# Patient Record
Sex: Male | Born: 1975 | Race: White | Hispanic: No | Marital: Married | Smoking: Current every day smoker
Health system: Southern US, Community
[De-identification: ages and names within clinical notes are randomized; demographics above are authoritative.]

## PROBLEM LIST (undated history)

## (undated) DIAGNOSIS — R51 Headache: Secondary | ICD-10-CM

## (undated) DIAGNOSIS — N200 Calculus of kidney: Secondary | ICD-10-CM

## (undated) HISTORY — PX: TRANSFER ADJACENT TISSUE GENITALIA: SUR1370

---

## 2010-07-19 DIAGNOSIS — N200 Calculus of kidney: Secondary | ICD-10-CM

## 2010-07-19 HISTORY — DX: Calculus of kidney: N20.0

## 2011-07-21 ENCOUNTER — Inpatient Hospital Stay (HOSPITAL_COMMUNITY)
Admission: EM | Admit: 2011-07-21 | Discharge: 2011-07-23 | DRG: 321 | Disposition: A | Discharge status: Home / Self Care | Payer: BC Managed Care – PPO | Attending: Internal Medicine | Admitting: Internal Medicine

## 2011-07-21 ENCOUNTER — Emergency Department (HOSPITAL_COMMUNITY): Payer: BC Managed Care – PPO

## 2011-07-21 ENCOUNTER — Encounter (HOSPITAL_COMMUNITY): Payer: Self-pay | Admitting: Physical Medicine and Rehabilitation

## 2011-07-21 DIAGNOSIS — N390 Urinary tract infection, site not specified: Secondary | ICD-10-CM

## 2011-07-21 DIAGNOSIS — N179 Acute kidney failure, unspecified: Secondary | ICD-10-CM | POA: Diagnosis present

## 2011-07-21 DIAGNOSIS — F17200 Nicotine dependence, unspecified, uncomplicated: Secondary | ICD-10-CM | POA: Diagnosis present

## 2011-07-21 DIAGNOSIS — N12 Tubulo-interstitial nephritis, not specified as acute or chronic: Principal | ICD-10-CM | POA: Diagnosis present

## 2011-07-21 DIAGNOSIS — B009 Herpesviral infection, unspecified: Secondary | ICD-10-CM | POA: Diagnosis present

## 2011-07-21 DIAGNOSIS — N200 Calculus of kidney: Secondary | ICD-10-CM | POA: Insufficient documentation

## 2011-07-21 DIAGNOSIS — N3289 Other specified disorders of bladder: Secondary | ICD-10-CM

## 2011-07-21 HISTORY — DX: Headache: R51

## 2011-07-21 HISTORY — DX: Calculus of kidney: N20.0

## 2011-07-21 LAB — COMPREHENSIVE METABOLIC PANEL
ALT: 45 U/L (ref 0–53)
Calcium: 9.6 mg/dL (ref 8.4–10.5)
Creatinine, Ser: 1.21 mg/dL (ref 0.50–1.35)
GFR calc Af Amer: 88 mL/min — ABNORMAL LOW (ref >90)
Glucose, Bld: 104 mg/dL — ABNORMAL HIGH (ref 70–99)
Sodium: 138 mEq/L (ref 135–145)
Total Protein: 7.4 g/dL (ref 6.0–8.3)

## 2011-07-21 LAB — URINALYSIS, ROUTINE W REFLEX MICROSCOPIC
Bilirubin Urine: NEGATIVE
Nitrite: POSITIVE — AB
Specific Gravity, Urine: 1.013 (ref 1.005–1.030)
Urobilinogen, UA: 1 mg/dL (ref 0.0–1.0)
pH: 7.5 (ref 5.0–8.0)

## 2011-07-21 LAB — URINE MICROSCOPIC-ADD ON

## 2011-07-21 LAB — CBC WITH DIFFERENTIAL/PLATELET
Basophils Absolute: 0 10*3/uL (ref 0.0–0.1)
Eosinophils Absolute: 0 10*3/uL (ref 0.0–0.7)
Eosinophils Relative: 0 % (ref 0–5)
HCT: 44.5 % (ref 39.0–52.0)
Lymphs Abs: 1.1 10*3/uL (ref 0.7–4.0)
MCH: 30.8 pg (ref 26.0–34.0)
MCV: 89.5 fL (ref 78.0–100.0)
Monocytes Absolute: 0.1 10*3/uL (ref 0.1–1.0)
Platelets: 276 10*3/uL (ref 150–400)
RDW: 13.1 % (ref 11.5–15.5)

## 2011-07-21 LAB — CREATININE, URINE, RANDOM: Creatinine, Urine: 214.28 mg/dL

## 2011-07-21 MED ORDER — SODIUM CHLORIDE 0.9 % IV SOLN: Freq: Once | INTRAVENOUS | Status: DC

## 2011-07-21 MED ORDER — SODIUM CHLORIDE 0.9 % IV SOLN
INTRAVENOUS | Status: AC
  Administered 2011-07-21 (×2): via INTRAVENOUS
  Administered 2011-07-22: 1000 mL via INTRAVENOUS
  Administered 2011-07-22: 05:00:00 via INTRAVENOUS

## 2011-07-21 MED ORDER — SODIUM CHLORIDE 0.9 % IV BOLUS (SEPSIS)
1000.0000 mL | Freq: Once | INTRAVENOUS | Status: AC
  Administered 2011-07-21: 1000 mL via INTRAVENOUS

## 2011-07-21 MED ORDER — MORPHINE SULFATE 2 MG/ML IJ SOLN: 2.0000 mg | INTRAMUSCULAR | Status: DC | PRN

## 2011-07-21 MED ORDER — ONDANSETRON HCL 4 MG PO TABS: 4.0000 mg | ORAL_TABLET | Freq: Four times a day (QID) | ORAL | Status: DC | PRN

## 2011-07-21 MED ORDER — ACETAMINOPHEN 650 MG RE SUPP: 650.0000 mg | Freq: Four times a day (QID) | RECTAL | Status: DC | PRN

## 2011-07-21 MED ORDER — KETOROLAC TROMETHAMINE 30 MG/ML IJ SOLN
30.0000 mg | Freq: Once | INTRAMUSCULAR | Status: AC
  Administered 2011-07-21: 30 mg via INTRAVENOUS
  Filled 2011-07-21: qty 1

## 2011-07-21 MED ORDER — DEXTROSE 5 % IV SOLN
1.0000 g | INTRAVENOUS | Status: DC
  Administered 2011-07-21 – 2011-07-23 (×3): 1 g via INTRAVENOUS
  Filled 2011-07-21 (×4): qty 10

## 2011-07-21 MED ORDER — ONDANSETRON HCL 4 MG/2ML IJ SOLN: 4.0000 mg | Freq: Four times a day (QID) | INTRAMUSCULAR | Status: DC | PRN

## 2011-07-21 MED ORDER — ACETAMINOPHEN 325 MG PO TABS
650.0000 mg | ORAL_TABLET | Freq: Four times a day (QID) | ORAL | Status: DC | PRN
  Administered 2011-07-21 – 2011-07-22 (×3): 650 mg via ORAL
  Filled 2011-07-21 (×3): qty 2

## 2011-07-21 MED ORDER — ONDANSETRON HCL 4 MG/2ML IJ SOLN
4.0000 mg | Freq: Once | INTRAMUSCULAR | Status: AC
  Administered 2011-07-21: 4 mg via INTRAVENOUS
  Filled 2011-07-21: qty 2

## 2011-07-21 MED ORDER — ACETAMINOPHEN 325 MG PO TABS
ORAL_TABLET | ORAL | Status: AC
  Administered 2011-07-21: 650 mg
  Filled 2011-07-21: qty 2

## 2011-07-21 NOTE — Progress Notes (Signed)
07/21/11  1706 Pt. Shivering;temp. Taken 101.1-Tylenol given and Dr. Ronita Hipps med student notified.  Leandrew Koyanagi Timo Hartwig,RN

## 2011-07-21 NOTE — H&P (Signed)
Hospital Admission Note Date: 07/21/2011  Patient name: Joshua Melendez Medical record number: [id number] Date of birth: 08-14-1975 Age: 36 y.o. Gender: male PCP: No primary provider on file.  Medical Service: Internal Medicine Teaching Service, [facility] Service  Attending physician:  Dr. Doneen Poisson    1st Contact: Alanson Puls, MS IV Pager: [phone] 2nd Contact: Dr. Lyn Hollingshead   Pager: [phone] After 5 pm or weekends: 1st Contact:      Pager: [phone] 2nd Contact:      Pager: [phone]  Chief Complaint: Fever, bilateral flank pain  History of Present Illness: Mr. Langlois is a 35 year old man with a PMH significant for nephrolithiasis who presents to the [facility] ED with a 1 week history of bilateral flank pain, fever, and dysuria.  He states that it started as a pain in the bilateral low back and a mild fever that resolved with tylenol but the morning of admission he continued to have the pain and dysuria and spiked a fever to 103 F.  He states that he has had a burning sensation when urinating for 1 week and also noticed blood in his urine during that time.  He has a history of kidney stones for the last year and actually passed a stone several months ago.  He denies weight loss, nausea, vomiting, headaches, neck stiffness, or abdominal pain.  He takes no other medications and has no other medical problems.   Meds: No current outpatient prescriptions on file.  Allergies: Allergies as of 07/21/2011  . (No Known Allergies)   Past Medical History  Diagnosis Date  . Nephrolithiasis 07/2010    initially found on CT 1 year ago in [place], repeat stones in 04/2011   History reviewed. No pertinent past surgical history. Family History  Problem Relation Age of Onset  . Hypertension Mother   . Diabetes Father   . Stroke Father 30  . Stroke Maternal Grandmother   . Stroke Maternal Grandfather    History   Social History  . Marital Status: Married    Spouse Name: N/A    Number of Children:  N/A  . Years of Education: N/A   Occupational History  . Not on file.   Social History Main Topics  . Smoking status: Current Everyday Smoker -- 1.0 packs/day    Types: Cigarettes  . Smokeless tobacco: Not on file  . Alcohol Use: No  . Drug Use: No  . Sexually Active:    Other Topics Concern  . Not on file   Social History Narrative   Newly immigrated to [place] from [place] in 08/2010.  Over the road truck driver.  Lives in [place] with wife and 85 year old son.  Has green card and insurance through work.    Review of Systems: Constitutional: Positive for fever, chills.  Denies diaphoresis, appetite change and fatigue.  HEENT: Denies photophobia, eye pain, redness, hearing loss, ear pain, congestion, sore throat, rhinorrhea, sneezing, mouth sores, trouble swallowing, neck pain, neck stiffness and tinnitus.   Respiratory: Denies SOB, DOE, cough, chest tightness,  and wheezing.   Cardiovascular: Denies chest pain, palpitations and leg swelling.  Gastrointestinal: Denies nausea, vomiting, abdominal pain, diarrhea, constipation, blood in stool and abdominal distention.  Genitourinary: Positive for dysuria, hematuria, flank pain.  Denies urgency, frequency, and difficulty urinating.  Musculoskeletal: Denies myalgias, back pain, joint swelling, arthralgias and gait problem.  Skin: Denies pallor, rash and wound.  Neurological: Denies dizziness, seizures, syncope, weakness, light-headedness, numbness and headaches.  Hematological: Denies adenopathy.  Easy bruising, personal or family bleeding history  Psychiatric/Behavioral: Denies suicidal ideation, mood changes, confusion, nervousness, sleep disturbance and agitation  Physical Exam: Blood pressure 112/68, pulse 103, temperature 98.7 F (37.1 C), temperature source Oral, resp. rate 18, height 5\' 7"  (1.702 m), weight 194 lb 6.4 oz (88.179 kg), SpO2 95.00%. Constitutional: Vital signs reviewed.  Patient is a well-developed and well-nourished  man in no acute distress and cooperative with exam. Alert and oriented x3. His wife is at bedside and helps with translation.  He speaks [place] as his native language. He is diaphoretic and appears mildly uncomfortable.  Head: Normocephalic and atraumatic Ear: TM normal bilaterally Mouth: no erythema or exudates, MMM Eyes: PERRL, EOMI, conjunctivae normal, No scleral icterus.  Neck: Supple, Trachea midline normal ROM, No JVD, mass, thyromegaly, or carotid bruit present.  Cardiovascular: RRR, S1 normal, S2 normal, no MRG, pulses symmetric and intact bilaterally Pulmonary/Chest: CTAB, no wheezes, rales, or rhonchi Abdominal: Soft. Non-tender, non-distended, bowel sounds are normal, no masses, organomegaly, or guarding present.  GU: no CVA tenderness Musculoskeletal: No joint deformities, erythema, or stiffness, ROM full and no nontender Hematology: no cervical, inginal, or axillary adenopathy.  Neurological: A&O x3, Strength is normal and symmetric bilaterally, cranial nerve II-XII are grossly intact, no focal motor deficit, sensory intact to light touch bilaterally.  Skin: Warm, dry and intact. No rash, cyanosis, or clubbing.  Psychiatric: Normal mood and affect. speech and behavior is normal. Judgment and thought content normal. Cognition and memory are normal.   Lab results: Basic Metabolic Panel:  Basename 07/21/11 1009  NA 138  K 4.1  CL 101  CO2 23  GLUCOSE 104*  BUN 10  CREATININE 1.21  CALCIUM 9.6  MG --  PHOS --   Liver Function Tests:  Basename 07/21/11 1009  AST 30  ALT 45  ALKPHOS 138*  BILITOT 1.0  PROT 7.4  ALBUMIN 3.9   CBC:  Basename 07/21/11 1009  WBC 14.7*  NEUTROABS 13.5*  HGB 15.3  HCT 44.5  MCV 89.5  PLT 276   Urinalysis:  Basename 07/21/11 0955  COLORURINE YELLOW  LABSPEC 1.013  PHURINE 7.5  GLUCOSEU NEGATIVE  HGBUR NEGATIVE  BILIRUBINUR NEGATIVE  KETONESUR NEGATIVE  PROTEINUR NEGATIVE  UROBILINOGEN 1.0  NITRITE POSITIVE*    LEUKOCYTESUR TRACE*   Imaging results:  Ct Abdomen Pelvis Wo Contrast  07/21/2011  *RADIOLOGY REPORT*  Clinical Data: Bilateral flank pain, dysuria, urinary urgency; gross hematuria last week, history kidney stones  CT ABDOMEN AND PELVIS WITHOUT CONTRAST  Technique:  Multidetector CT imaging of the abdomen and pelvis was performed following the standard protocol without intravenous contrast. Sagittal and coronal MPR images reconstructed from axial data set.  Comparison: None  Findings: Bibasilar atelectasis. Scattered respiratory motion artifacts. Tiny nonobstructing calculus upper pole left kidney. No additional urinary tract calcification, hydronephrosis, or ureteral dilatation. Normal size prostate gland. Questionable small hyperdense mass at anterior dome of bladder 1.5 x 1.3 x 1.1 cm. This could represent a bladder tumor or urachal remnant/tumor.  Within limits of a nonenhanced exam, no significant abnormalities of the liver, spleen, pancreas, kidneys, or adrenal glands otherwise identified. Normal appendix. Tiny umbilical hernia containing fat. Stomach and bowel loops normal appearance. No mass, adenopathy, free fluid, or inflammatory process. No acute osseous findings.  IMPRESSION: Tiny nonobstructing calculus upper pole left kidney. Small soft tissue nodule 1.5 x 1.3 x 1.1 cm at the anterior superior margin of the urinary bladder, cannot exclude a urachal abnormality/tumor or bladder tumor. Cystoscopic evaluation recommended.  No evidence of urinary tract obstruction or dilatation. Tiny umbilical hernia containing fat.  Original Report Authenticated By: Lollie Marrow, M.D.   Assessment & Plan by Problem: 1. Pyelonephritis:  Mr. Laraia is a 36 year old man who presents with flank pain, dysuria, and fever for the last week.  His Urinalysis is positive for nitrates, 7-10 WBC and many bacteria.  CT scan does not show signs of pyelonephritis but these findings coupled with his increased WBC count indicate  pyelonephritis.    - Admit to med-surg  - Rocephin 1 g IV Q24  - Tylenol for fever and pain  - Urine culture to narrow Abx and possible change to oral.   2. Possible Bladder Mass: CT scan showed a questionable mass in the anterior dome of the bladder that was 1.5 cm by 1.3 cm by 1.1 cm.  He is a smoker and has had some hematuria but his UA done today is not positive for blood.  Differential diagnosis includes artifact vs possible bladder mass.    - Urology consult for cystoscopy   3. Acute kidney injury:  His Creatinine today is 1.21 without a known baseline.  His calculated GFR is 96 ml/min.  He has no history of renal disease. He does state that over the last week his appetite has been down because he was feeling ill.   - Check Urine lytes to calculate FENa  - Hydration with NS  - Recheck in AM.  4.  VTE: SCDs  Signed: Alexus Michael 07/21/2011, 2:19 PM

## 2011-07-21 NOTE — Consult Note (Signed)
Consult: bladder mass, pyelonephritis, kidney stone Requested by Dr. Josem Melendez  History of Present Illness: [name] is a 36 year old man who presented to the [facility] ED with a 2 week history of dysuria. 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 He states that it started as dysuria, progressed to pain in the bilateral low back and a mild fever that resolved with tylenol. However, by this morning he developed high fever, chills and and severe bilateral flank pain. He states that he has had a burning sensation when urinating for 2 weeks. He has noticed red urine, but no gross hematuria with clots.   He reports he is voiding with a good stream and has no bothersome lower urinary tract symptoms apart from the dysuria.  He has a history of kidney stones for the last year and passed a stone last year while they still lived in [place]. His wife served as Equities trader and she is an Radio producer. e denies weight loss, nausea, vomiting, headaches, neck stiffness, or abdominal pain. He takes no other medications and has no other medical problems.    Past Medical History  Diagnosis Date  . Nephrolithiasis 07/2010    initially found on CT 1 year ago in [place], repeat stones in 04/2011  . Headache    Past Surgical History  Procedure Date  . Transfer adjacent tissue genitalia     Home Medications:  No prescriptions prior to admission   Allergies: No Known Allergies  Family History  Problem Relation Age of Onset  . Hypertension Mother   . Diabetes Father   . Stroke Father 70  . Stroke Maternal Grandmother   . Stroke Maternal Grandfather    Social History:  reports that he has been smoking Cigarettes.  He has a 10 pack-year smoking history. He has never used smokeless tobacco. He reports that he does not drink alcohol or use illicit drugs.  ROS: A complete review of systems was performed.  All systems are negative except for pertinent findings as noted. @ROS @   Physical Exam:  Vital signs in  last 24 hours: Temp:  [98.7 F (37.1 C)-103.3 F (39.6 C)] 103.3 F (39.6 C) (07/03 1839) Pulse Rate:  [103-150] 103  (07/03 1343) Resp:  [18-24] 18  (07/03 1343) BP: (99-112)/(51-89) 112/68 mmHg (07/03 1343) SpO2:  [95 %-99 %] 95 % (07/03 1343) Weight:  [88.179 kg (194 lb 6.4 oz)] 88.179 kg (194 lb 6.4 oz) (07/03 1343) General:  Alert and oriented, No acute distress. Looks sweaty.  HEENT: Normocephalic, atraumatic Neck: No JVD or lymphadenopathy Cardiovascular: Regular rate and rhythm Lungs: Regular rate and effort Abdomen: Soft, nontender, nondistended, no abdominal masses Back: No CVA tenderness Extremities: No edema Neurologic: Grossly intact  Laboratory Data:  Results for orders placed during the hospital encounter of 07/21/11 (from the past 24 hour(s))  URINALYSIS, ROUTINE W REFLEX MICROSCOPIC     Status: Abnormal   Collection Time   07/21/11  9:55 AM      Component Value Range   Color, Urine YELLOW  YELLOW   APPearance CLEAR  CLEAR   Specific Gravity, Urine 1.013  1.005 - 1.030   pH 7.5  5.0 - 8.0   Glucose, UA NEGATIVE  NEGATIVE mg/dL   Hgb urine dipstick NEGATIVE  NEGATIVE   Bilirubin Urine NEGATIVE  NEGATIVE   Ketones, ur NEGATIVE  NEGATIVE mg/dL   Protein, ur NEGATIVE  NEGATIVE mg/dL   Urobilinogen, UA 1.0  0.0 - 1.0 mg/dL  Nitrite POSITIVE (*) NEGATIVE   Leukocytes, UA TRACE (*) NEGATIVE  URINE MICROSCOPIC-ADD ON     Status: Abnormal   Collection Time   07/21/11  9:55 AM      Component Value Range   WBC, UA 7-10  <3 WBC/hpf   RBC / HPF 0-2  <3 RBC/hpf   Bacteria, UA MANY (*) RARE  CBC WITH DIFFERENTIAL     Status: Abnormal   Collection Time   07/21/11 10:09 AM      Component Value Range   WBC 14.7 (*) 4.0 - 10.5 K/uL   RBC 4.97  4.22 - 5.81 MIL/uL   Hemoglobin 15.3  13.0 - 17.0 g/dL   HCT 04.5  40.9 - 81.1 %   MCV 89.5  78.0 - 100.0 fL   MCH 30.8  26.0 - 34.0 pg   MCHC 34.4  30.0 - 36.0 g/dL   RDW 91.4  78.2 - 95.6 %   Platelets 276  150 - 400 K/uL    Neutrophils Relative 92 (*) 43 - 77 %   Neutro Abs 13.5 (*) 1.7 - 7.7 K/uL   Lymphocytes Relative 7 (*) 12 - 46 %   Lymphs Abs 1.1  0.7 - 4.0 K/uL   Monocytes Relative 1 (*) 3 - 12 %   Monocytes Absolute 0.1  0.1 - 1.0 K/uL   Eosinophils Relative 0  0 - 5 %   Eosinophils Absolute 0.0  0.0 - 0.7 K/uL   Basophils Relative 0  0 - 1 %   Basophils Absolute 0.0  0.0 - 0.1 K/uL  COMPREHENSIVE METABOLIC PANEL     Status: Abnormal   Collection Time   07/21/11 10:09 AM      Component Value Range   Sodium 138  135 - 145 mEq/L   Potassium 4.1  3.5 - 5.1 mEq/L   Chloride 101  96 - 112 mEq/L   CO2 23  19 - 32 mEq/L   Glucose, Bld 104 (*) 70 - 99 mg/dL   BUN 10  6 - 23 mg/dL   Creatinine, Ser 2.13  0.50 - 1.35 mg/dL   Calcium 9.6  8.4 - 08.6 mg/dL   Total Protein 7.4  6.0 - 8.3 g/dL   Albumin 3.9  3.5 - 5.2 g/dL   AST 30  0 - 37 U/L   ALT 45  0 - 53 U/L   Alkaline Phosphatase 138 (*) 39 - 117 U/L   Total Bilirubin 1.0  0.3 - 1.2 mg/dL   GFR calc non Af Amer 76 (*) >90 mL/min   GFR calc Af Amer 88 (*) >90 mL/min   No results found for this or any previous visit (from the past 240 hour(s)). Creatinine:  Basename 07/21/11 1009  CREATININE 1.21   CT A/P - I reviewed the images and reviewed the images with the patient and wife on the computer in his room. There is a hint of calcium in a left upper pole papilla - likely a very early stone, too small to measure. No hydronephrosis. Thick, hyperdense area anterior bladder c/w possible urachal remnant/bladder neoplasm.   Impression/Assessment:  -Pyelonephritis - likely from an ascending prostatitis, UTI that has been festering for a couple of week.                            On Rocephin.  -Nephrolithiasis - no clinically significant stones at present -Urachal remnant/bladder neoplasm - will need cystoscopy in  the outpatient office in 2-3 weeks once he settles down from his infection.   Plan:  -No GU intervention needed -Patient and wife given my  card/contact info and discussed importance of f/u for cystoscopy. This will also allow an opportunity to recheck a U/A with C&S if needed.   Joshua Melendez 07/21/2011, 8:29 PM

## 2011-07-21 NOTE — ED Notes (Signed)
Pt. Back from xray

## 2011-07-21 NOTE — Progress Notes (Signed)
1400 Patient arrived to floor via stretcher from ER live at home with wife who is at the bedside. Nontelemetry  Skin WNL

## 2011-07-21 NOTE — Care Management Note (Unsigned)
    Page 1 of 1   07/21/2011     5:07:40 PM   CARE MANAGEMENT NOTE 07/21/2011  Patient:  Joshua Melendez, Joshua Melendez   Account Number:  [id number]  Date Initiated:  07/21/2011  Documentation initiated by:  Letha Cape  Subjective/Objective Assessment:   dx pyelonephritis  admit- lives with spouse. pta independent.     Action/Plan:   Anticipated DC Date:  07/23/2011   Anticipated DC Plan:  HOME/SELF CARE      DC Planning Services  CM consult      Choice offered to / List presented to:             Status of service:  In process, will continue to follow Medicare Important Message given?   (If response is "NO", the following Medicare IM given date fields will be blank) Date Medicare IM given:   Date Additional Medicare IM given:    Discharge Disposition:    Per UR Regulation:  Reviewed for med. necessity/level of care/duration of stay  If discussed at Long Length of Stay Meetings, dates discussed:    Comments:  07/21/11 17:05 Letha Cape RN, BSN [phone] patient lives with spouse, pta independent, patient does not speak [place], but spouse translates for him.  Patient has medication coverage and transportation.  Per spouse he does not have a PCP but they will go through their insurance to get PCP.  NCM will continue to follow for dc needs.

## 2011-07-21 NOTE — ED Notes (Signed)
Pt presents to department for evaluation of bilateral flank pain, diaphoresis, and dysuria/hematuria. Onset today. Pt states history of kidney stones, thinks this could be same. Also states vomiting and fever. 10/10 pain at the time. Pt is alert and oriented x4.

## 2011-07-21 NOTE — ED Notes (Signed)
Pt. Has hx of kidney stone. Last one was one month ago. Sx appear the same as they did then,.

## 2011-07-21 NOTE — Plan of Care (Signed)
Problem: Phase I Progression Outcomes Goal: Hemodynamically stable Outcome: Not Progressing See progress notes.

## 2011-07-21 NOTE — Progress Notes (Signed)
07/21/11  1750  T. Campbell,MD med student called again and informed of temp 102.8; and informed her that pt.'s wife wants to speak with a Dr. about labs esp. urine results, and stated someone will come.  Leandrew Koyanagi Benuel Ly,RN

## 2011-07-21 NOTE — Progress Notes (Signed)
07/21/11  1835  Temp. 103.3  Dr. Sherrine Maples called and informed; and told again that wife wants to speak with Dr. about temp. and results.  Informed wife that I called the Dr.  Leandrew Koyanagi. Noris Kulinski,RN

## 2011-07-21 NOTE — ED Provider Notes (Signed)
History     CSN: [id number]  Arrival date & time 07/21/11  0910   First MD Initiated Contact with Patient 07/21/11 [phone]      Chief Complaint  Patient presents with  . Flank Pain  . Dysuria    (Consider location/radiation/quality/duration/timing/severity/associated sxs/prior treatment) Patient is a 36 y.o. male presenting with flank pain and dysuria. The history is provided by the patient and the spouse.  Flank Pain  Dysuria  Associated symptoms include flank pain.  He having intermittent bilateral flank pain for the last week but it got much worse this morning. Pain was severe and rated at 10 over 10. He also had a headache this morning. He had nausea and vomiting and chills. He is not sure if he had a fever or not. He is now resolved and back pain is present but less severe. Was diagnosed with a kidney stone 6 months ago and is worried that he has a recurrence of the kidney stones. This diagnosis was made while he was living in [place]. Makes his pain any better nothing makes it any worse. Dysuria and hematuria.  No past medical history on file.  No past surgical history on file.  History reviewed. No pertinent family history.  History  Substance Use Topics  . Smoking status: Current Everyday Smoker -- 1.0 packs/day    Types: Cigarettes  . Smokeless tobacco: Not on file  . Alcohol Use: No      Review of Systems  Genitourinary: Positive for dysuria and flank pain.  All other systems reviewed and are negative.    Allergies  Review of patient's allergies indicates no known allergies.  Home Medications  No current outpatient prescriptions on file.  BP 101/51  Pulse 150  Resp 24  SpO2 99%  Physical Exam  Nursing note and vitals reviewed. -year-old male who is resting comfortably and in no acute distress. Vital signs are significant for fever with temperature 103, tachycardia with heart rate 150, tachypnea with respiratory rate of 24. Oxygen saturation is 99%  which is normal. Head is normocephalic and atraumatic. PERRLA, EOMI. Fundi show no hemorrhage, exudate, or papilledema. Neck is nontender and supple. Back is nontender. Lungs are clear without rales, wheezes, rhonchi. Heart has regular rate rhythm without murmur. Abdomen is soft, flat, nontender without masses or hepatosplenomegaly. Peristalsis is decreased. Extremities have no cyanosis or edema, full range of motion is present. Skin is warm and dry without rash. Neurologic: Mental status is normal, cranial nerves are intact, there are no motor or sensory deficits.  ED Course  Procedures (including critical care time)  Results for orders placed during the hospital encounter of 07/21/11  CBC WITH DIFFERENTIAL      Component Value Range   WBC 14.7 (*) 4.0 - 10.5 K/uL   RBC 4.97  4.22 - 5.81 MIL/uL   Hemoglobin 15.3  13.0 - 17.0 g/dL   HCT 11.9  14.7 - 82.9 %   MCV 89.5  78.0 - 100.0 fL   MCH 30.8  26.0 - 34.0 pg   MCHC 34.4  30.0 - 36.0 g/dL   RDW 56.2  13.0 - 86.5 %   Platelets 276  150 - 400 K/uL   Neutrophils Relative 92 (*) 43 - 77 %   Neutro Abs 13.5 (*) 1.7 - 7.7 K/uL   Lymphocytes Relative 7 (*) 12 - 46 %   Lymphs Abs 1.1  0.7 - 4.0 K/uL   Monocytes Relative 1 (*) 3 - 12 %  Monocytes Absolute 0.1  0.1 - 1.0 K/uL   Eosinophils Relative 0  0 - 5 %   Eosinophils Absolute 0.0  0.0 - 0.7 K/uL   Basophils Relative 0  0 - 1 %   Basophils Absolute 0.0  0.0 - 0.1 K/uL  COMPREHENSIVE METABOLIC PANEL      Component Value Range   Sodium 138  135 - 145 mEq/L   Potassium 4.1  3.5 - 5.1 mEq/L   Chloride 101  96 - 112 mEq/L   CO2 23  19 - 32 mEq/L   Glucose, Bld 104 (*) 70 - 99 mg/dL   BUN 10  6 - 23 mg/dL   Creatinine, Ser 3.08  0.50 - 1.35 mg/dL   Calcium 9.6  8.4 - 65.7 mg/dL   Total Protein 7.4  6.0 - 8.3 g/dL   Albumin 3.9  3.5 - 5.2 g/dL   AST 30  0 - 37 U/L   ALT 45  0 - 53 U/L   Alkaline Phosphatase 138 (*) 39 - 117 U/L   Total Bilirubin 1.0  0.3 - 1.2 mg/dL   GFR calc non Af  Amer 76 (*) >90 mL/min   GFR calc Af Amer 88 (*) >90 mL/min  URINALYSIS, ROUTINE W REFLEX MICROSCOPIC      Component Value Range   Color, Urine YELLOW  YELLOW   APPearance CLEAR  CLEAR   Specific Gravity, Urine 1.013  1.005 - 1.030   pH 7.5  5.0 - 8.0   Glucose, UA NEGATIVE  NEGATIVE mg/dL   Hgb urine dipstick NEGATIVE  NEGATIVE   Bilirubin Urine NEGATIVE  NEGATIVE   Ketones, ur NEGATIVE  NEGATIVE mg/dL   Protein, ur NEGATIVE  NEGATIVE mg/dL   Urobilinogen, UA 1.0  0.0 - 1.0 mg/dL   Nitrite POSITIVE (*) NEGATIVE   Leukocytes, UA TRACE (*) NEGATIVE  URINE MICROSCOPIC-ADD ON      Component Value Range   WBC, UA 7-10  <3 WBC/hpf   RBC / HPF 0-2  <3 RBC/hpf   Bacteria, UA MANY (*) RARE   Ct Abdomen Pelvis Wo Contrast  07/21/2011  *RADIOLOGY REPORT*  Clinical Data: Bilateral flank pain, dysuria, urinary urgency; gross hematuria last week, history kidney stones  CT ABDOMEN AND PELVIS WITHOUT CONTRAST  Technique:  Multidetector CT imaging of the abdomen and pelvis was performed following the standard protocol without intravenous contrast. Sagittal and coronal MPR images reconstructed from axial data set.  Comparison: None  Findings: Bibasilar atelectasis. Scattered respiratory motion artifacts. Tiny nonobstructing calculus upper pole left kidney. No additional urinary tract calcification, hydronephrosis, or ureteral dilatation. Normal size prostate gland. Questionable small hyperdense mass at anterior dome of bladder 1.5 x 1.3 x 1.1 cm. This could represent a bladder tumor or urachal remnant/tumor.  Within limits of a nonenhanced exam, no significant abnormalities of the liver, spleen, pancreas, kidneys, or adrenal glands otherwise identified. Normal appendix. Tiny umbilical hernia containing fat. Stomach and bowel loops normal appearance. No mass, adenopathy, free fluid, or inflammatory process. No acute osseous findings.  IMPRESSION: Tiny nonobstructing calculus upper pole left kidney. Small soft  tissue nodule 1.5 x 1.3 x 1.1 cm at the anterior superior margin of the urinary bladder, cannot exclude a urachal abnormality/tumor or bladder tumor. Cystoscopic evaluation recommended. No evidence of urinary tract obstruction or dilatation. Tiny umbilical hernia containing fat.  Original Report Authenticated By: Lollie Marrow, M.D.      Date: 07/21/2011  Rate: 119  Rhythm: sinus tachycardia  QRS  Axis: left  Intervals: normal  ST/T Wave abnormalities: early repolarization  Conduction Disutrbances:none  Narrative Interpretation:   Old EKG Reviewed: none available    1. Urinary tract infection       MDM  Flank pain and fever worrisome for possible pyelonephritis. Tachycardia is probably secondary to fever. He will be given an IV fluid bolus and CT scan will be obtained as well as routine laboratory workup. Acetaminophen and ketorolac are given for fever and pain.  Heart rate has come down to 116. Urinalysis is positive for urinary tract infection. CT scan suggests possible urachal remnant in the bladder. Because of fever, leukocytosis, and a urinary tract infection in a young male, in he will need to be admitted for IV antibiotics. He is started on Rocephin. Case is discussed with resident on call for outpatient clinics who agrees to admit the patient. Urology evaluation will be needed.    Dione Booze, MD 07/21/11 1154

## 2011-07-22 LAB — COMPREHENSIVE METABOLIC PANEL
ALT: 136 U/L — ABNORMAL HIGH (ref 0–53)
AST: 90 U/L — ABNORMAL HIGH (ref 0–37)
CO2: 22 mEq/L (ref 19–32)
Calcium: 8.1 mg/dL — ABNORMAL LOW (ref 8.4–10.5)
GFR calc non Af Amer: >90 mL/min (ref >90)
Sodium: 141 mEq/L (ref 135–145)

## 2011-07-22 NOTE — H&P (Signed)
Internal Medicine Teaching Service Attending Note Date: 07/22/2011  Patient name: Joshua Melendez  Medical record number: [id number]  Date of birth: [birth year]   I have seen and evaluated Joshua Melendez and discussed their care with the Residency Team.  35 yr. Old male with hx significant for nephrolithiasis, presented with fever and flank pain. He complained of dysuria and was noted to have fever of 103 F.  He also complained of some hematuria on occasion, no clots. Currently states he feels much better. No CP, no SOB, no N/V/D/C. Denies abdominal pain. Overnight noted to spike fevers to 103 F again. CT Abd/pelvis with "small soft tissue nodule 1.5 x 1.3 x 1.1 cm at the anterior  superior margin of the urinary bladder, cannot exclude a urachal  abnormality/tumor or bladder tumor" per radiology.   Physical Exam: Blood pressure 107/71, pulse 91, temperature 98.3 F (36.8 C), temperature source Oral, resp. rate 18, height 5\' 7"  (1.702 m), weight 88.179 kg (194 lb 6.4 oz), SpO2 93.00%. BP 107/71  Pulse 91  Temp 98.3 F (36.8 C) (Oral)  Resp 18  Ht 5\' 7"  (1.702 m)  Wt 88.179 kg (194 lb 6.4 oz)  BMI 30.45 kg/m2  SpO2 93% General appearance: alert, cooperative, appears stated age and no distress Head: Normocephalic, without obvious abnormality, atraumatic Nose: Nares normal. Septum midline. Mucosa normal. No drainage or sinus tenderness. Back: symmetric, no curvature. ROM normal. No CVA tenderness. Lungs: clear to auscultation bilaterally Heart: regular rate and rhythm, S1, S2 normal, no murmur, click, rub or gallop Abdomen: soft, non-tender; bowel sounds normal; no masses,  no organomegaly Extremities: extremities normal, atraumatic, no cyanosis or edema Neurologic: Grossly normal  Lab results: Results for orders placed during the hospital encounter of 07/21/11 (from the past 24 hour(s))  SODIUM, URINE, RANDOM     Status: Normal   Collection Time   07/21/11  8:52 PM      Component Value Range    Sodium, Ur 27    CREATININE, URINE, RANDOM     Status: Normal   Collection Time   07/21/11  8:52 PM      Component Value Range   Creatinine, Urine 214.28    COMPREHENSIVE METABOLIC PANEL     Status: Abnormal   Collection Time   07/22/11  6:20 AM      Component Value Range   Sodium 141  135 - 145 mEq/L   Potassium 3.4 (*) 3.5 - 5.1 mEq/L   Chloride 108  96 - 112 mEq/L   CO2 22  19 - 32 mEq/L   Glucose, Bld 105 (*) 70 - 99 mg/dL   BUN 9  6 - 23 mg/dL   Creatinine, Ser 1.47  0.50 - 1.35 mg/dL   Calcium 8.1 (*) 8.4 - 10.5 mg/dL   Total Protein 5.8 (*) 6.0 - 8.3 g/dL   Albumin 2.9 (*) 3.5 - 5.2 g/dL   AST 90 (*) 0 - 37 U/L   ALT 136 (*) 0 - 53 U/L   Alkaline Phosphatase 116  39 - 117 U/L   Total Bilirubin 1.0  0.3 - 1.2 mg/dL   GFR calc non Af Amer >90  >90 mL/min   GFR calc Af Amer >90  >90 mL/min    Imaging results:  Ct Abdomen Pelvis Wo Contrast  07/21/2011  *RADIOLOGY REPORT*  Clinical Data: Bilateral flank pain, dysuria, urinary urgency; gross hematuria last week, history kidney stones  CT ABDOMEN AND PELVIS WITHOUT CONTRAST  Technique:  Multidetector CT imaging  of the abdomen and pelvis was performed following the standard protocol without intravenous contrast. Sagittal and coronal MPR images reconstructed from axial data set.  Comparison: None  Findings: Bibasilar atelectasis. Scattered respiratory motion artifacts. Tiny nonobstructing calculus upper pole left kidney. No additional urinary tract calcification, hydronephrosis, or ureteral dilatation. Normal size prostate gland. Questionable small hyperdense mass at anterior dome of bladder 1.5 x 1.3 x 1.1 cm. This could represent a bladder tumor or urachal remnant/tumor.  Within limits of a nonenhanced exam, no significant abnormalities of the liver, spleen, pancreas, kidneys, or adrenal glands otherwise identified. Normal appendix. Tiny umbilical hernia containing fat. Stomach and bowel loops normal appearance. No mass, adenopathy, free  fluid, or inflammatory process. No acute osseous findings.  IMPRESSION: Tiny nonobstructing calculus upper pole left kidney. Small soft tissue nodule 1.5 x 1.3 x 1.1 cm at the anterior superior margin of the urinary bladder, cannot exclude a urachal abnormality/tumor or bladder tumor. Cystoscopic evaluation recommended. No evidence of urinary tract obstruction or dilatation. Tiny umbilical hernia containing fat.  Original Report Authenticated By: Lollie Marrow, M.D.    Assessment and Plan: I agree with the formulated Assessment and Plan with the following changes: 35 yr. Old male with hx nephrolithiasis presented with Acute Pyelonephritis. 1) Acute Pyelonephritis: Obtain BC and follow up on UC. Agree with current dose of Rocephin 1 g IV q24hrs.  Clinically he does not look toxic, and is hemodynamically stable, though still noted to have fever overnight. I would monitor this and follow culture data. Continue IVF. 2) Bladder mass?: Will need follow up with urology for cystoscopy. He has hx tobacco use and has no blood in UA. Urachal remnant? 3) Elevated LFTs: Denies abdominal pain. ALT has almost tripled with normal ALP. Would repeat in the AM. May be secondary to infection, doubt obstruction. Discontinue Tylenol if continues to increase. -Rest per resident physician note.  Jonah Blue, DO 7/4/201310:44 AM

## 2011-07-22 NOTE — Progress Notes (Addendum)
Subjective: Patient feels better overall. Does not have any flank pain or back pain.  Does complain of fevers - says comes every 6 hours.  Temperature 103.1 overnight. Although vitals otherwise stable- no tachycardia or hypotension. Denies any nausea, vomiting, abdominal pain. Objective: Vital signs in last 24 hours: Filed Vitals:   07/22/11 0203 07/22/11 0448 07/22/11 0554 07/22/11 1000  BP:  124/73  107/71  Pulse:  114  91  Temp: 99.5 F (37.5 C) 103.1 F (39.5 C) 101.4 F (38.6 C) 98.3 F (36.8 C)  TempSrc:  Oral  Oral  Resp:  20  18  Height:      Weight:      SpO2:  94%  93%   Weight change:   Intake/Output Summary (Last 24 hours) at 07/22/11 1238 Last data filed at 07/22/11 0558  Gross per 24 hour  Intake   2610 ml  Output      0 ml  Net   2610 ml   General: resting in bed comfortably HEENT: PERRL, EOMI, no scleral icterus Cardiac: RRR, no rubs, murmurs or gallops Pulm: clear to auscultation bilaterally, moving normal volumes of air Abd: soft, nontender, nondistended, BS present Ext: warm and well perfused, no pedal edema Neuro: alert and oriented X3, cranial nerves II-XII grossly intact  Lab Results: Basic Metabolic Panel:  Lab 07/22/11 1610 07/21/11 1009  NA 141 138  K 3.4* 4.1  CL 108 101  CO2 22 23  GLUCOSE 105* 104*  BUN 9 10  CREATININE 0.90 1.21  CALCIUM 8.1* 9.6  MG -- --  PHOS -- --   Liver Function Tests:  Lab 07/22/11 0620 07/21/11 1009  AST 90* 30  ALT 136* 45  ALKPHOS 116 138*  BILITOT 1.0 1.0  PROT 5.8* 7.4  ALBUMIN 2.9* 3.9   CBC:  Lab 07/21/11 1009  WBC 14.7*  NEUTROABS 13.5*  HGB 15.3  HCT 44.5  MCV 89.5  PLT 276   Urinalysis:  Lab 07/21/11 0955  COLORURINE YELLOW  LABSPEC 1.013  PHURINE 7.5  GLUCOSEU NEGATIVE  HGBUR NEGATIVE  BILIRUBINUR NEGATIVE  KETONESUR NEGATIVE  PROTEINUR NEGATIVE  UROBILINOGEN 1.0  NITRITE POSITIVE*  LEUKOCYTESUR TRACE*   Studies/Results: Ct Abdomen Pelvis Wo Contrast  07/21/2011   *RADIOLOGY REPORT*  Clinical Data: Bilateral flank pain, dysuria, urinary urgency; gross hematuria last week, history kidney stones  CT ABDOMEN AND PELVIS WITHOUT CONTRAST  Technique:  Multidetector CT imaging of the abdomen and pelvis was performed following the standard protocol without intravenous contrast. Sagittal and coronal MPR images reconstructed from axial data set.  Comparison: None  Findings: Bibasilar atelectasis. Scattered respiratory motion artifacts. Tiny nonobstructing calculus upper pole left kidney. No additional urinary tract calcification, hydronephrosis, or ureteral dilatation. Normal size prostate gland. Questionable small hyperdense mass at anterior dome of bladder 1.5 x 1.3 x 1.1 cm. This could represent a bladder tumor or urachal remnant/tumor.  Within limits of a nonenhanced exam, no significant abnormalities of the liver, spleen, pancreas, kidneys, or adrenal glands otherwise identified. Normal appendix. Tiny umbilical hernia containing fat. Stomach and bowel loops normal appearance. No mass, adenopathy, free fluid, or inflammatory process. No acute osseous findings.  IMPRESSION: Tiny nonobstructing calculus upper pole left kidney. Small soft tissue nodule 1.5 x 1.3 x 1.1 cm at the anterior superior margin of the urinary bladder, cannot exclude a urachal abnormality/tumor or bladder tumor. Cystoscopic evaluation recommended. No evidence of urinary tract obstruction or dilatation. Tiny umbilical hernia containing fat.  Original Report Authenticated By: Loraine Leriche  A. Tyron Russell, M.D.   Medications: I have reviewed the patient's current medications. Scheduled Meds:   . cefTRIAXone (ROCEPHIN) IVPB 1 gram/50 mL D5W  1 g Intravenous Q24H  . DISCONTD: sodium chloride   Intravenous Once   Continuous Infusions:   . sodium chloride 1,000 mL (07/22/11 1152)   PRN Meds:.acetaminophen, acetaminophen, morphine injection, ondansetron (ZOFRAN) IV, ondansetron Assessment/Plan:  1. Pyelonephritis:  patient getting better clinically except still has fever. No vital signs abnormalities. Will collect blood cultures x2.  - No renal abscess per CT scan.  - Rocephin 1 g IV Q24  - Tylenol for fever and pain  - Urine culture pending. - Pending blood culture. - If fever continues or clinically gets worse- will consider broadening antibiotic coverage.  2. Possible Bladder Mass: CT scan showed a questionable mass in the anterior dome of the bladder that was 1.5 cm by 1.3 cm by 1.1 cm. He is a smoker and has had some hematuria but his UA done today is not positive for blood.  -  Urology consult- cystoscopy in 2-3 weeks as outpatient- at which time the repeat urine analysis and culture.  3. elevated AST and ALT : Unclear etiology at present. Will recheck tomorrow morning and consider further workup as needed including but not limited to acute hepatitis panel.  3. disposition : Pending clinical improvement.    LOS: 1 day   Taimane Stimmel 07/22/2011, 12:38 PM

## 2011-07-23 DIAGNOSIS — B009 Herpesviral infection, unspecified: Secondary | ICD-10-CM

## 2011-07-23 LAB — HEPATIC FUNCTION PANEL
ALT: 106 U/L — ABNORMAL HIGH (ref 0–53)
Alkaline Phosphatase: 131 U/L — ABNORMAL HIGH (ref 39–117)
Bilirubin, Direct: 0.2 mg/dL (ref 0.0–0.3)
Indirect Bilirubin: 0.4 mg/dL (ref 0.3–0.9)
Total Bilirubin: 0.6 mg/dL (ref 0.3–1.2)

## 2011-07-23 MED ORDER — SULFAMETHOXAZOLE-TRIMETHOPRIM 400-80 MG PO TABS: 1.0000 | ORAL_TABLET | Freq: Two times a day (BID) | ORAL | Status: DC

## 2011-07-23 MED ORDER — ACETAMINOPHEN 325 MG PO TABS: 650.0000 mg | ORAL_TABLET | Freq: Four times a day (QID) | ORAL | Status: AC | PRN

## 2011-07-23 MED ORDER — ACYCLOVIR 400 MG PO TABS: 400.0000 mg | ORAL_TABLET | Freq: Three times a day (TID) | ORAL | Status: AC

## 2011-07-23 MED ORDER — SULFAMETHOXAZOLE-TRIMETHOPRIM 400-80 MG PO TABS: 1.0000 | ORAL_TABLET | Freq: Two times a day (BID) | ORAL | Status: AC

## 2011-07-23 NOTE — Progress Notes (Signed)
Patient being discharged from hospital today. IV sites removed. Discharge insyructions reviewed with patient and family. Harless Litten , RN

## 2011-07-23 NOTE — Progress Notes (Signed)
Subjective: Pt is a 36 y/o M on HD#2 for pyelonephritis.  He feels much better today, and has not had any back pain in the last 24 hours; his last fever was at 1600 yesterday with a spike 102.7.  He has had no other fevers since then.   He reports that he is regaining his appetite and taking in liquids well.  Denies any nausea, vomiting, abdominal pain.  Objective: Vital signs in last 24 hours: Filed Vitals:   07/22/11 1600 07/22/11 1739 07/22/11 2144 07/23/11 0527  BP:  108/72 105/69 107/72  Pulse:  98 81 77  Temp: 102.7 F (39.3 C) 98.1 F (36.7 C) 98.4 F (36.9 C) 98.5 F (36.9 C)  TempSrc: Axillary Oral Oral Oral  Resp:  20 18 20   Height:      Weight:      SpO2:  95% 95% 97%   Weight change:   Intake/Output Summary (Last 24 hours) at 07/23/11 1159 Last data filed at 07/23/11 0600  Gross per 24 hour  Intake    340 ml  Output      0 ml  Net    340 ml   General: resting in bed comfortably Cardiac: RRR, no rubs, murmurs or gallops Pulm: clear to auscultation bilaterally  Abd: soft, nontender, nondistended, no CVA tenderness or flank pain Ext: warm and well perfused, no pedal edema    Lab Results: Basic Metabolic Panel:  Lab 07/22/11 1610 07/21/11 1009  NA 141 138  K 3.4* 4.1  CL 108 101  CO2 22 23  GLUCOSE 105* 104*  BUN 9 10  CREATININE 0.90 1.21  CALCIUM 8.1* 9.6  MG -- --  PHOS -- --   Liver Function Tests:  Lab 07/23/11 0645 07/22/11 0620  AST 43* 90*  ALT 106* 136*  ALKPHOS 131* 116  BILITOT 0.6 1.0  PROT 6.6 5.8*  ALBUMIN 3.2* 2.9*   CBC:  Lab 07/21/11 1009  WBC 14.7*  NEUTROABS 13.5*  HGB 15.3  HCT 44.5  MCV 89.5  PLT 276   Urinalysis:  Lab 07/21/11 0955  COLORURINE YELLOW  LABSPEC 1.013  PHURINE 7.5  GLUCOSEU NEGATIVE  HGBUR NEGATIVE  BILIRUBINUR NEGATIVE  KETONESUR NEGATIVE  PROTEINUR NEGATIVE  UROBILINOGEN 1.0  NITRITE POSITIVE*  LEUKOCYTESUR TRACE*   Studies/Results: No results found. Medications: I have reviewed the  patient's current medications. Scheduled Meds:    . cefTRIAXone (ROCEPHIN) IVPB 1 gram/50 mL D5W  1 g Intravenous Q24H   Continuous Infusions:    . sodium chloride 1,000 mL (07/22/11 1152)   PRN Meds:.acetaminophen, acetaminophen, morphine injection, ondansetron (ZOFRAN) IV, ondansetron Assessment/Plan:  1. Pyelonephritis: patient getting better clinically and has not had a fever in the last 16 hours.  He has no other reported vital sign abnormalities and is progressing well on his diet. Urine cultures came back positive with E.coli >100,000 cfu/mL, but we are still awaiting sensitivity results.   Blood cultures were collected yesterday because of temperature spikes, but there is no growth to date on those cultures.  Pt will receive scheduled dose of ceftriaxone today, and we will  Prescribe him bactrim p.o. for 11 days (14 days total treatment) to prepare for discharge.   - No renal abscess per CT scan. - Rocephin 1 g IV Q24  - Tylenol for fever and pain  - Follow-up urine culture sensitivities . - Follow-up blood cultures   2. Possible Bladder Mass: CT scan showed a questionable mass in the anterior dome of the  bladder that was 1.5 cm by 1.3 cm by 1.1 cm. He is a smoker and has had some hematuria but his UA on admission was not positive for blood. Urology consulted and recommended cystoscopy in 2-3 weeks as outpatient- at which time they will repeat urine analysis and culture, if necessary.    3. elevated AST and ALT : Unclear etiology at present. Will recheck tomorrow morning and consider further workup as needed including but not limited to acute hepatitis panel.  4. Social: Pt's wife is an Radio producer, and is very concerned about the mass.  She is also concerned with the upcoming cystoscopy, and expressed that she would rather have an ultrasound performed first. We discussed that cystoscopy will be the best option for diagnosis of this mass, and urology will decide the best next  step after this is performed.   5.  Disposition : Pt is likely to go home today after receiving dose #3 of antibiotics.      LOS: 2 days   CAMPBELL, TIMICA D 07/23/2011, 11:59 AM   Resident Co-sign Daily Note: I have seen the patient and reviewed the daily progress note by Alanson Puls MS 4 and discussed the care of the patient with them.  See below for documentation of my findings, assessment, and plans.  Subjective: Pt felt better. Had no back pain, flank pain or Dysuria. No fever overnight.  Objective: Vital signs in last 24 hours: Filed Vitals:   07/22/11 1600 07/22/11 1739 07/22/11 2144 07/23/11 0527  BP:  108/72 105/69 107/72  Pulse:  98 81 77  Temp: 102.7 F (39.3 C) 98.1 F (36.7 C) 98.4 F (36.9 C) 98.5 F (36.9 C)  TempSrc: Axillary Oral Oral Oral  Resp:  20 18 20   Height:      Weight:      SpO2:  95% 95% 97%   Physical Exam: General: resting in bed HEENT: PERRL, EOMI, no scleral icterus Cardiac: RRR, no rubs, murmurs or gallops Pulm: clear to auscultation bilaterally, moving normal volumes of air Abd: soft, nontender, nondistended, BS present Ext: warm and well perfused, no pedal edema Neuro: alert and oriented X3, cranial nerves II-XII grossly intact  Lab Results: Reviewed and documented in Electronic Record Micro Results: Reviewed and documented in Electronic Record Studies/Results: Reviewed and documented in Electronic Record Medications: I have reviewed the patient's current medications. Scheduled Meds:   . DISCONTD: cefTRIAXone (ROCEPHIN) IVPB 1 gram/50 mL D5W  1 g Intravenous Q24H   Continuous Infusions:  PRN Meds:.DISCONTD: acetaminophen, DISCONTD: acetaminophen, DISCONTD:  morphine injection, DISCONTD: ondansetron (ZOFRAN) IV, DISCONTD: ondansetron Assessment/Plan: Principal Problem:  *Pyelonephritis Active Problems:  Acute kidney injury  Pt will be discharged home in a stable condition with 3 weeks total ( Dr. Estil Daft recs )of Rx  with Bactrim DS BID. Will f/u with Urology as out-pt for possible cystoscopy in 2-3 weeks. 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  Please see D/C summary for further details.   LOS: 2 days   Athena Baltz 07/23/2011, 7:38 PM

## 2011-07-23 NOTE — Progress Notes (Signed)
Internal Medicine Attending  Date: 07/23/2011  Patient name: Thierno Hun Medical record number: [id number] Date of birth: 10-14-1975 Age: 36 y.o. Gender: male  I saw and evaluated the patient. I discussed Mr. Cartwright interim history with Dr. Allena Katz and we formulated the assessment and plan together.  Mr. Docter is feeling much improved this morning denying back pain, nausea or vomiting.  His last fever was yesterday afternoon and he is looking forward to discharge today.  We discussed with Mr. Mabile wife that cystoscopy was the gold standard with regards to determining if there was an intrabladder process and that ultrasound was unlikely to add any information to the pelvic CT that was already performed.  I agree with the housestaff's plan to discharge Mr. Chappuis home today with a 2 week course of Bactrim DS for E. Coli pyelonephritis (pending sensitivities) with follow-up in Urology in 2 weeks for a cystoscopy.

## 2011-07-24 LAB — URINE CULTURE

## 2011-07-25 NOTE — Discharge Summary (Signed)
Internal Medicine Teaching [facility] Discharge Note  Name: Joshua Melendez MRN: [id number] DOB: [birth year] 36 y.o.  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      acyclovir 400 MG tablet   Commonly known as: ZOVIRAX   Take 1 tablet (400 mg total) by mouth 3 (three) times daily.      sulfamethoxazole-trimethoprim 400-80 MG per tablet   Commonly known as: BACTRIM,SEPTRA   Take 1 tablet by mouth 2 (two) times daily.            Disposition and follow-up:   Joshua Melendez was discharged from [facility] in stable and improved condition.  He will follow up with Dr. Mena Goes at [facility], and at that appointment they will discuss whether the patient should needs to remain on antibiotic therapy for longer duration (for possible prostatitis), resolution of his symptoms and the bladder mass seen on CT.     Follow-up Appointments: Follow-up Information    Follow up with Eskridge, Lowella Petties, MD. (Call the office to make apointment in 2-3 weeks.)    Contact information:   [address] [facility] [facility] [address] [facility] [place] [address] [phone]         Discharge Orders    Future Orders Please Complete By Expires   Diet general      Increase activity slowly      Discharge instructions      Comments:   Please start taking Bactrim DS 1 tablet twice daily for 11 more days to complete total 2 weeks course of antibiotics. Please  make an appointment with Dr. Mena Goes in 2-3 weeks for Cystoscopy and re-evaluation for resolution of Urine infection. Take Tylenol as needed for pain.   Call MD for:  temperature >100.4      Call MD for:  persistant nausea and vomiting      Call MD for:  severe uncontrolled pain         Consultations: Treatment Team:  Antony Haste, MD- Urology  Physical Exam - key components related to admission:  BP 107/71  Pulse 91  Temp 98.3 F (36.8 C) (Oral)  Resp 18  Ht 5\' 7"  (1.702 m)  Wt 88.179 kg (194 lb 6.4 oz)  BMI 30.45 kg/m2  SpO2 93%   General appearance: alert, cooperative, appears stated age and no distress  Head: Normocephalic, without obvious abnormality, atraumatic  Nose: Nares normal. Septum midline. Mucosa normal. No drainage or sinus tenderness.  Back: symmetric, no curvature. ROM normal. No CVA tenderness.  Lungs: clear to auscultation bilaterally  Heart: regular rate and rhythm, S1, S2 normal, no murmur, click, rub or gallop  Abdomen: soft, non-tender; bowel sounds normal; no masses, no organomegaly  Extremities: extremities normal, atraumatic, no cyanosis or edema  Neurologic: Grossly normal  Lab Results:  Basic Metabolic Panel:   Lab  07/22/11 0620  07/21/11 1009   NA  141  138   K  3.4*  4.1  CL  108  101   CO2  22  23   GLUCOSE  105*  104*   BUN  9  10   CREATININE  0.90  1.21   CALCIUM  8.1*  9.6    Liver Function Tests:   Lab  07/22/11 0620  07/21/11 1009   AST  90*  30   ALT  136*  45   ALKPHOS  116  138*   BILITOT  1.0  1.0   PROT  5.8*  7.4   ALBUMIN  2.9*  3.9    CBC:   Lab  07/21/11 1009   WBC  14.7*   NEUTROABS  13.5*   HGB  15.3   HCT  44.5   MCV  89.5   PLT  276    Urinalysis:   Lab  07/21/11 0955   COLORURINE  YELLOW   LABSPEC  1.013   PHURINE  7.5   GLUCOSEU  NEGATIVE   HGBUR  NEGATIVE   BILIRUBINUR  NEGATIVE   KETONESUR  NEGATIVE   PROTEINUR  NEGATIVE   UROBILINOGEN  1.0   NITRITE  POSITIVE*     LEUKOCYTESUR  TRACE*      Procedures Performed:  Ct Abdomen Pelvis Wo Contrast  07/21/2011  *RADIOLOGY REPORT*  Clinical Data: Bilateral flank pain, dysuria, urinary urgency; gross hematuria last week, history kidney stones  CT ABDOMEN AND PELVIS WITHOUT CONTRAST  Technique:  Multidetector CT imaging of the abdomen and pelvis was performed following the standard protocol without intravenous contrast. Sagittal and coronal MPR images reconstructed from axial data set.  Comparison: None  Findings: Bibasilar atelectasis. Scattered respiratory motion artifacts. Tiny nonobstructing calculus upper pole left kidney. No additional urinary tract calcification, hydronephrosis, or ureteral dilatation. Normal size prostate gland. Questionable small hyperdense mass at anterior dome of bladder 1.5 x 1.3 x 1.1 cm. This could represent a bladder tumor or urachal remnant/tumor.  Within limits of a nonenhanced exam, no significant abnormalities of the liver, spleen, pancreas, kidneys, or adrenal glands otherwise identified. Normal appendix. Tiny umbilical hernia containing fat. Stomach and bowel loops normal appearance. No mass, adenopathy, free fluid, or inflammatory process. No acute osseous findings.  IMPRESSION: Tiny nonobstructing calculus upper pole left kidney. Small soft tissue nodule 1.5 x 1.3 x 1.1 cm at the anterior superior margin of the urinary bladder, cannot exclude a urachal abnormality/tumor or bladder tumor. Cystoscopic evaluation recommended. No evidence of urinary tract obstruction or dilatation. Tiny umbilical hernia containing fat.  Original Report Authenticated By: Lollie Marrow, M.D.     Admission HPI: Joshua Melendez is a 36 year old man with a PMH significant for nephrolithiasis who presents to the [facility] ED with a 1 week history of bilateral flank pain, fever, and dysuria.  He states that it started as a pain in the bilateral low back and a mild fever that resolved with tylenol but the morning of  admission he continued to have the pain and dysuria and spiked a fever to 103 F.  He states that he has had a burning sensation when urinating for 1 week and also noticed blood in his urine during that time.  He has a history of kidney stones for the last year and actually passed a stone several months ago.  He denies weight loss, nausea, vomiting, headaches, neck stiffness, or abdominal pain.  He takes no other medications and has no other medical problems.    Hospital Course by problem list: Principal Problem:  *Pyelonephritis  Active Problems:  Bladder Mass per CT scan  Elevated Transaminases  Herpes Labialis  Hospital Course by problem list: 1. Pyelonephritis:  Pt presented with 1 wk hx of flank pain, dysuria, and fever.   His UA was positive for nitrates, 7-10 WBC and many bacteria.  CT scan does not show signs of pyelonephritis but these findings coupled with his increased WBC count indicated pyelonephritis.   Pt received 3 days of Rocephin 1 g IV q.d.  and tylenol for spiking fevers.  These fevers resolved on HD 2, but blood cultures were sent to r/o bacteremia.  Urine cultures showed E. coli >100,000, and he was switched to po Bactrim.  Culture sensitivities showed susceptibility to this antibiotic.  Urology also mentioned in their note that the patient's symptoms could also be caused by prostatitis; pt's empiric therapy should cover this condition.  We extended treatment to 21 days total based on Urology's recommendations.              2. Possible Bladder Mass: CT scan showed a questionable mass in the anterior dome of the bladder that was 1.5 cm by 1.3 cm by 1.1 cm.  He is a smoker and has had some hematuria but the UA was not positive for blood.  Differential diagnosis included artifact vs possible bladder mass.   Urology was consulted for cystoscopy, and will follow-up in outpatient clinic for cystoscopy in 2-3 weeks.       3.  Elevated AST and ALT: Pt's AST/ALT were normal on admission,  but a repeat chemistry on HD#2 showed an elevation of AST 90 and ALT 136.  Etiology of elevation is most likely treatment related.  No further work-up during this admission.  4. Social: Pt's wife is an Radio producer, and is very concerned about the mass. She is also concerned with the upcoming cystoscopy, and expressed that she would rather have an ultrasound performed first. We discussed that cystoscopy will be the best option for diagnosis of this mass, and urology will decide the best next step after this is performed.  5. Herpes Labialis: Pt requested for acyclovir prescription before discharge as he had a flare up of his herpes labialis. Examination showed the same and so given prescription for acyclovir 400 mg 3 times a day for 2 weeks with 2 refills.  Discharge Vitals:  BP 107/72  Pulse 77  Temp 98.5 F (36.9 C) (Oral)  Resp 20  Ht 5\' 7"  (1.702 m)  Wt 194 lb 6.4 oz (88.179 kg)  BMI 30.45 kg/m2  SpO2 97%  Discharge Labs:   BMET    Component Value Date/Time   NA 141 07/22/2011 0620   K 3.4* 07/22/2011 0620   CL 108 07/22/2011 0620   CO2 22 07/22/2011 0620   GLUCOSE 105* 07/22/2011 0620   BUN 9 07/22/2011 0620   CREATININE 0.90 07/22/2011 0620   CALCIUM 8.1* 07/22/2011 0620   GFRNONAA >90 07/22/2011 0620   GFRAA >90 07/22/2011 0620   Urine Culture Results Specimen Description  URINE, RANDOM   Culture Setup Time  07/22/2011 02:01   Colony Count  >=100,000 COLONIES/ML   Culture  ESCHERICHIA COLI   Report Status  07/24/2011 FINAL    Culture & Susceptibility     ESCHERICHIA COLI          Antibiotic  Sensitivity  Microscan  Status     AMPICILLIN  Resistant  >=32  Final     Method:  MIC     CEFAZOLIN  Sensitive  <=  4  Final     Method:  MIC     CEFTRIAXONE  Sensitive  <=1  Final     Method:  MIC     CIPROFLOXACIN  Sensitive  <=0.25  Final     Method:  MIC     GENTAMICIN  Sensitive  <=1  Final     Method:  MIC     LEVOFLOXACIN  Sensitive  <=0.12  Final     Method:  MIC      NITROFURANTOIN  Sensitive  <=16  Final     Method:  MIC     PIP/TAZO  Sensitive  <=4  Final     Method:  MIC     TOBRAMYCIN  Sensitive  <=1  Final     Method:  MIC     TRIMETH/SULFA  Sensitive  <=20  Final     Method:  MIC   Blood cultures pending: Specimen Description BLOOD RIGHT ARM  Special Requests BOTTLES DRAWN AEROBIC AND ANAEROBIC 10CC  Culture Setup Time 07/22/2011 16:55  Culture BLOOD CULTURE RECEIVED NO GROWTH TO DATE CULTURE WILL BE HELD FOR 5 DAYS BEFORE ISSUING A FINAL NEGATIVE REPORT Report Status PENDING  Specimen Description BLOOD RIGHT HAND  Special Requests BOTTLES DRAWN AEROBIC AND ANAEROBIC 10CC Culture Setup  Time  07/22/2011 16:55  Culture  BLOOD CULTURE RECEIVED NO GROWTH TO DATE CULTURE WILL BE HELD FOR 5 DAYS BEFORE ISSUING A FINAL NEGATIVE REPORT Report Status PENDING   Signed: Karlis Cregg 07/25/2011, 9:00 AM   Time Spent on Discharge: 35 minutes

## 2011-07-28 LAB — CULTURE, BLOOD (ROUTINE X 2): Culture: NO GROWTH

## 2011-08-30 ENCOUNTER — Other Ambulatory Visit (HOSPITAL_COMMUNITY): Payer: Self-pay | Admitting: Urology

## 2011-08-30 DIAGNOSIS — D494 Neoplasm of unspecified behavior of bladder: Secondary | ICD-10-CM

## 2011-09-03 ENCOUNTER — Other Ambulatory Visit (HOSPITAL_COMMUNITY): Payer: BC Managed Care – PPO

## 2013-06-06 IMAGING — CT CT ABD-PELV W/O CM
2 of 4 series · 14 of 32 positions shown, 19 images · non-contrast
Comparison: None

CLINICAL DATA: Bilateral flank pain, dysuria, urinary urgency;
gross hematuria last week, history kidney stones

CT ABDOMEN AND PELVIS WITHOUT CONTRAST
TECHNIQUE: Multidetector CT imaging of the abdomen and pelvis was
performed following the standard protocol without intravenous
contrast. Sagittal and coronal MPR images reconstructed from axial
data set.

[Series 2: renal stone · axial · 0.70mm/px · z∈[-406,-71]mm · 7 of 91 slices shown, 12 images]
[im 12/91  soft-tissue]
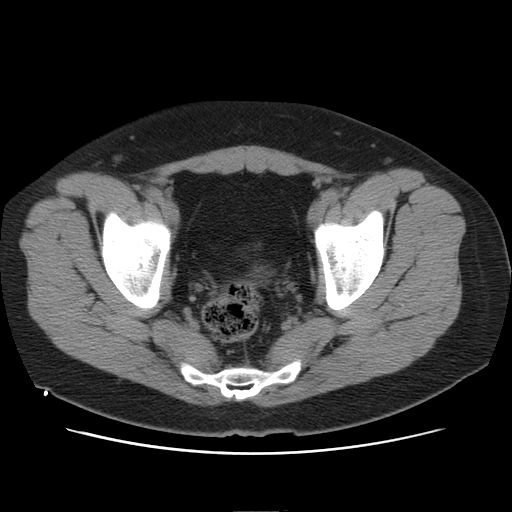
[im 12/91  bone]
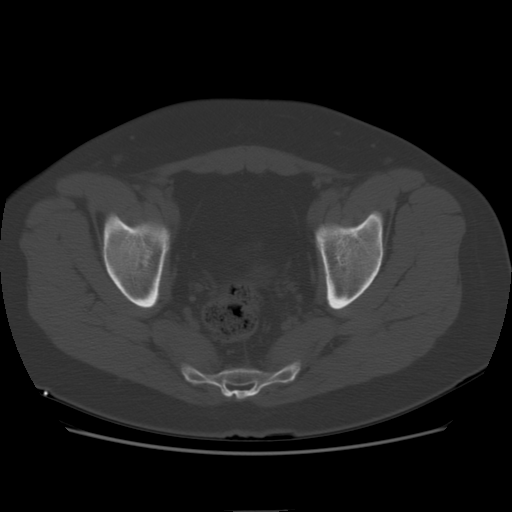
[im 23/91  soft-tissue]
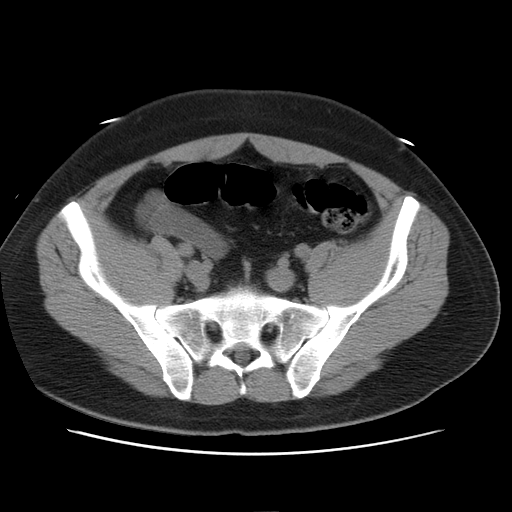
[im 34/91  soft-tissue]
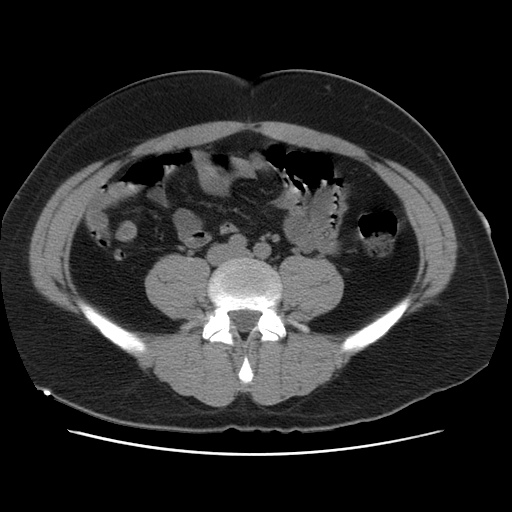
[im 46/91  soft-tissue]
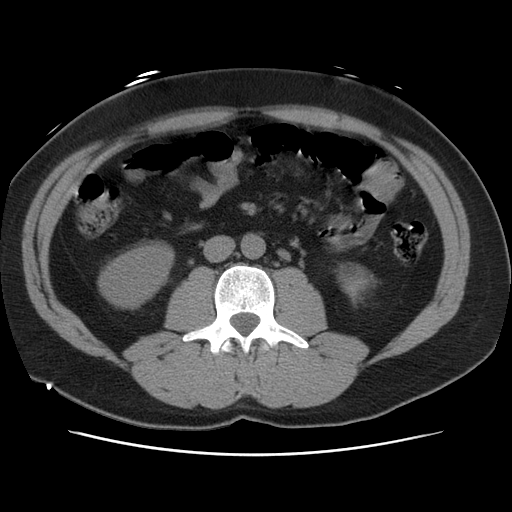
[im 46/91  lung]
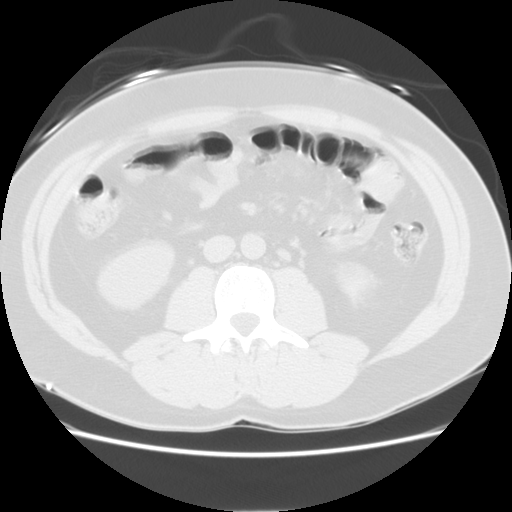
[im 57/91  soft-tissue]
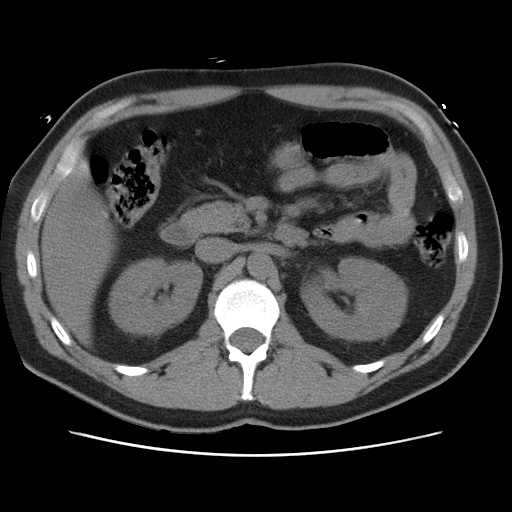
[im 57/91  lung]
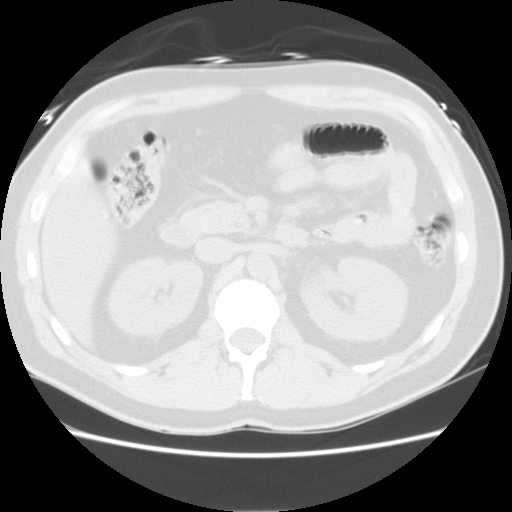
[im 68/91  soft-tissue]
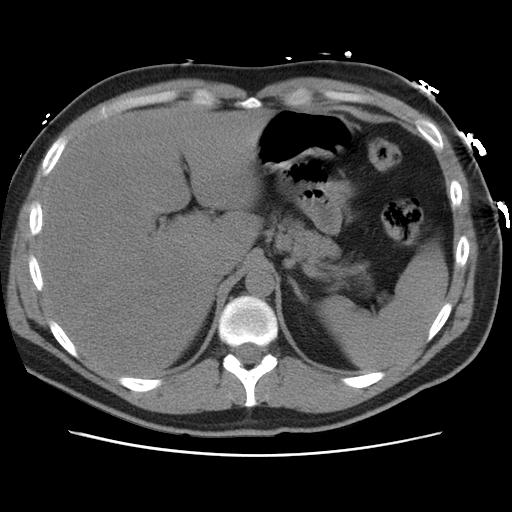
[im 68/91  lung]
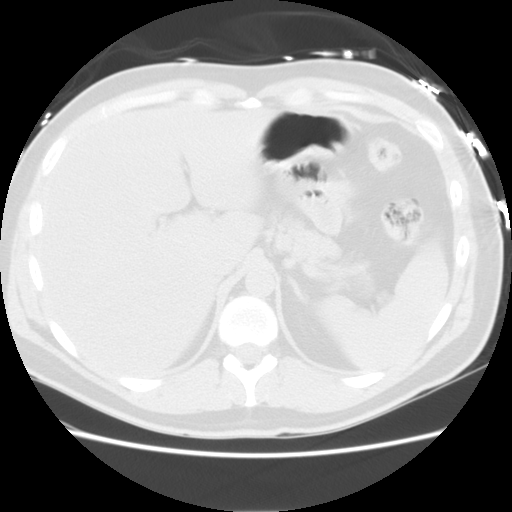
[im 79/91  soft-tissue]
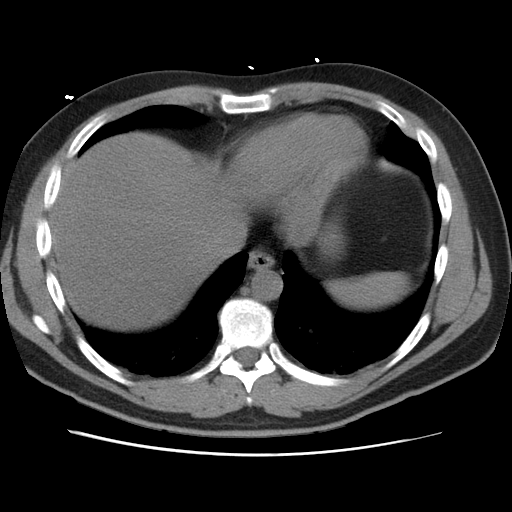
[im 79/91  lung]
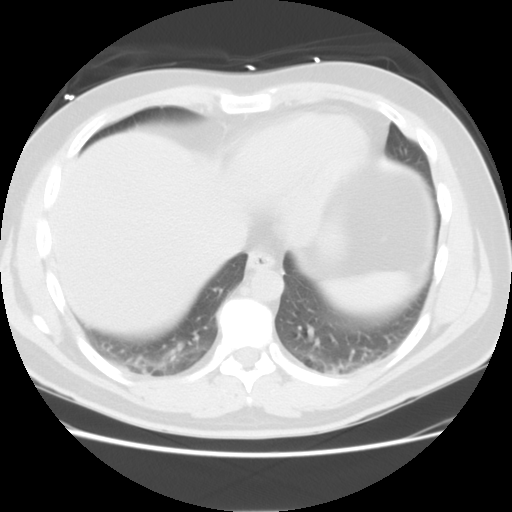

[Series 400: sagittals · sagittal · 0.99mm/px · 7 of 113 slices shown]
[im 11/113  soft-tissue]
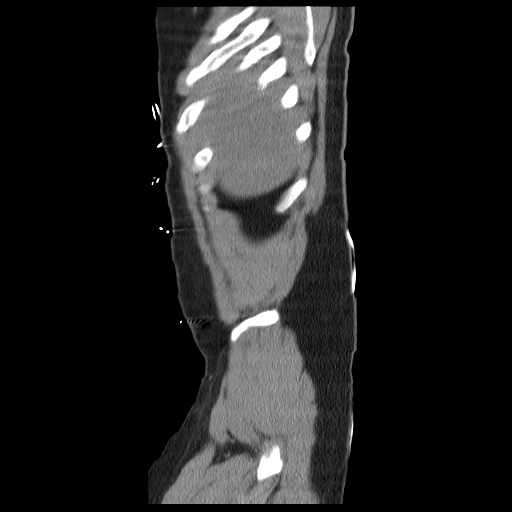
[im 21/113  soft-tissue]
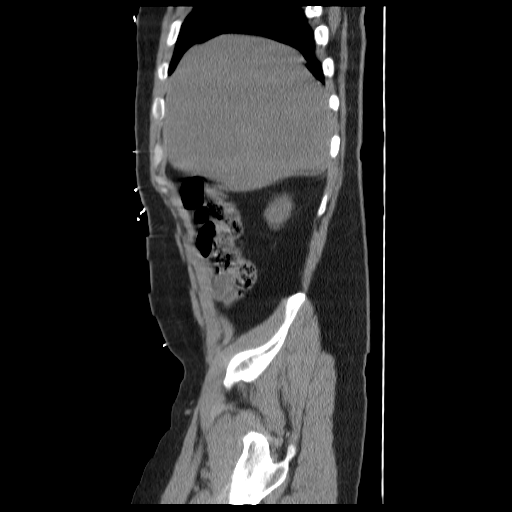
[im 41/113  soft-tissue]
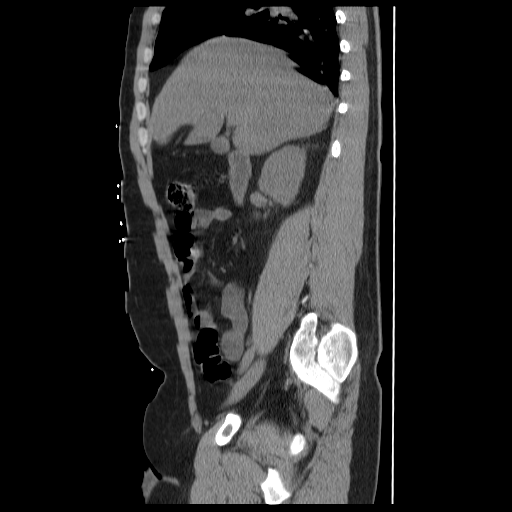
[im 51/113  soft-tissue]
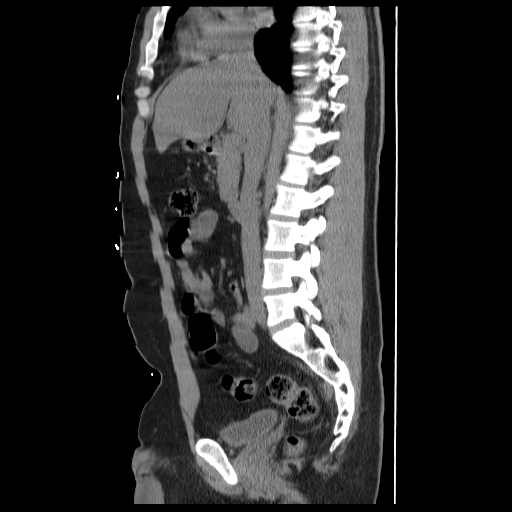
[im 62/113  soft-tissue]
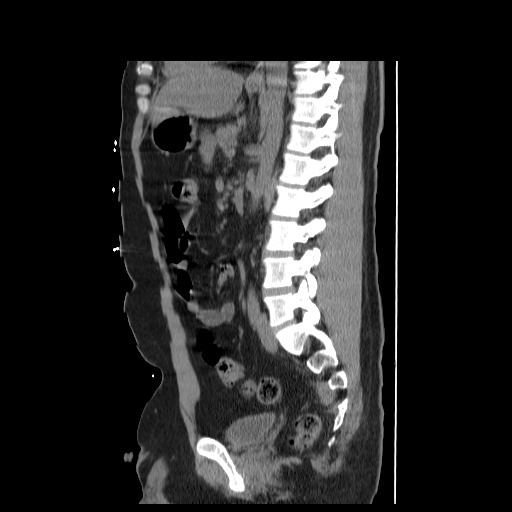
[im 72/113  soft-tissue]
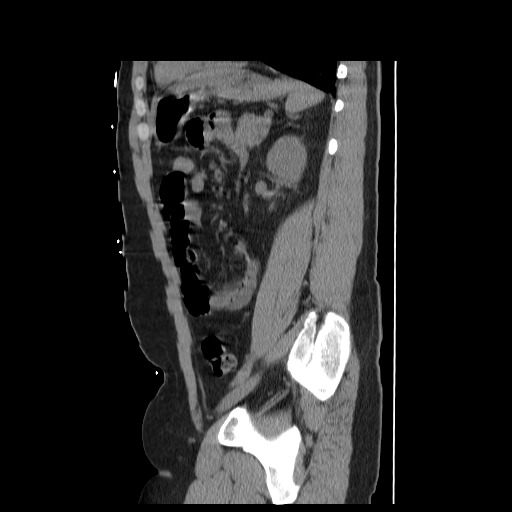
[im 92/113  soft-tissue]
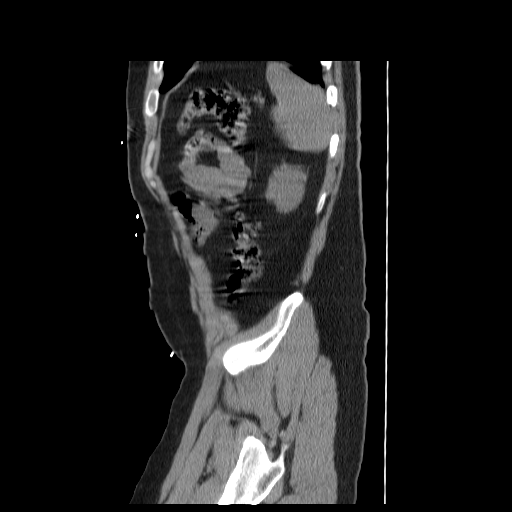

[14 of 32 positions shown; findings below may reference images not displayed]

FINDINGS: Bibasilar atelectasis.
Scattered respiratory motion artifacts.
Tiny nonobstructing calculus upper pole left kidney.
No additional urinary tract calcification, hydronephrosis, or
ureteral dilatation.
Normal size prostate gland.
Questionable small hyperdense mass at anterior dome of bladder
x 1.3 x 1.1 cm.
This could represent a bladder tumor or urachal remnant/tumor.

Within limits of a nonenhanced exam, no significant abnormalities
of the liver, spleen, pancreas, kidneys, or adrenal glands
otherwise identified.
Normal appendix.
Tiny umbilical hernia containing fat.
Stomach and bowel loops normal appearance.
No mass, adenopathy, free fluid, or inflammatory process.
No acute osseous findings.
IMPRESSION: Tiny nonobstructing calculus upper pole left kidney.
Small soft tissue nodule 1.5 x 1.3 x 1.1 cm at the anterior
superior margin of the urinary bladder, cannot exclude a urachal
abnormality/tumor or bladder tumor.
Cystoscopic evaluation recommended.
No evidence of urinary tract obstruction or dilatation.
Tiny umbilical hernia containing fat.

## 2017-11-18 ENCOUNTER — Ambulatory Visit (HOSPITAL_COMMUNITY)
Admission: EM | Admit: 2017-11-18 | Discharge: 2017-11-18 | Disposition: A | Discharge status: Home / Self Care | Payer: BLUE CROSS/BLUE SHIELD | Attending: Family Medicine | Admitting: Family Medicine

## 2017-11-18 ENCOUNTER — Encounter (HOSPITAL_COMMUNITY): Payer: Self-pay | Admitting: Emergency Medicine

## 2017-11-18 DIAGNOSIS — B070 Plantar wart: Secondary | ICD-10-CM | POA: Diagnosis not present

## 2017-11-18 NOTE — ED Provider Notes (Signed)
[place]    CSN: [id number] Arrival date & time: 11/18/17  1144     History   Chief Complaint Chief Complaint  Patient presents with  . Foreign Body in Skin    HPI Joshua Melendez is a 42 y.o. male.   HPI  Patient is here for left foot pain.  He states is been bothering him for couple of months.  He thinks that he was walking barefoot on the beach and thinks something got into his foot like a small piece of shell or glass.  When he puts pressure on this area he gets a foreign body sensation.  It is painful with walking.  He sometimes tries to shave the thick skin down.  He has not seen any foreign body.  He is notes any bleeding.  There has not been any purulence. He is otherwise in good health.  He states he is on no medication.     Past Medical History:  Diagnosis Date  . Headache(784.0)   . Nephrolithiasis 07/2010   initially found on CT 1 year ago in [place], repeat stones in 04/2011    Patient Active Problem List   Diagnosis Date Noted  . Pyelonephritis 07/21/2011  . Nephrolithiasis 07/21/2011  . Acute kidney injury ([place]) 07/21/2011    Past Surgical History:  Procedure Laterality Date  . TRANSFER ADJACENT TISSUE GENITALIA         Home Medications    Prior to Admission medications   Not on File    Family History Family History  Problem Relation Age of Onset  . Hypertension Mother   . Diabetes Father   . Stroke Father 25  . Stroke Maternal Grandmother   . Stroke Maternal Grandfather     Social History Social History   Tobacco Use  . Smoking status: Current Every Day Smoker    Packs/day: 1.00    Years: 10.00    Pack years: 10.00    Types: Cigarettes  . Smokeless tobacco: Never Used  Substance Use Topics  . Alcohol use: No  . Drug use: No     Allergies   Patient has no known allergies.   Review of Systems Review of Systems  Constitutional: Negative for chills and fever.  HENT: Negative for ear pain and sore throat.     Eyes: Negative for pain and visual disturbance.  Respiratory: Negative for cough and shortness of breath.   Cardiovascular: Negative for chest pain and palpitations.  Gastrointestinal: Negative for abdominal pain and vomiting.  Genitourinary: Negative for dysuria and hematuria.  Musculoskeletal: Negative for arthralgias and back pain.  Skin: Positive for wound. Negative for color change and rash.  Neurological: Negative for seizures and syncope.  All other systems reviewed and are negative.    Physical Exam Triage Vital Signs ED Triage Vitals  Enc Vitals Group     BP 11/18/17 1234 131/77     Pulse Rate 11/18/17 1234 87     Resp 11/18/17 1234 18     Temp 11/18/17 1234 98.2 F (36.8 C)     Temp src --      SpO2 11/18/17 1234 97 %     Weight --    No data found.  Updated Vital Signs BP 131/77   Pulse 87   Temp 98.2 F (36.8 C)   Resp 18   SpO2 97%      Physical Exam  Constitutional: He appears well-developed and well-nourished. No distress.  HENT:  Head: Normocephalic and atraumatic.  Mouth/Throat: Oropharynx is clear and moist.  Eyes: Pupils are equal, round, and reactive to light. Conjunctivae are normal.  Neck: Normal range of motion.  Cardiovascular: Normal rate.  Pulmonary/Chest: Effort normal. No respiratory distress.  Abdominal: Soft. He exhibits no distension.  Musculoskeletal: Normal range of motion. He exhibits no edema.  Neurological: He is alert.  Skin: Skin is warm and dry.  The bottom of the foot has hyperkeratotic region centrally over the ball.  The center has punctate dark red discoloration.  It is typical for a plantar wart.  It is shaved down with a scalpel.     UC Treatments / Results  Labs (all labs ordered are listed, but only abnormal results are displayed) Labs Reviewed - No data to display  EKG None  Radiology No results found.  Procedures Procedures (including critical care time)  Medications Ordered in UC Medications - No  data to display  Initial Impression / Assessment and Plan / UC Course  I have reviewed the triage vital signs and the nursing notes.  Pertinent labs & imaging results that were available during my care of the patient were reviewed by me and considered in my medical decision making (see chart for details).     I showed the patient pictures of plantar warts on the computer.  Google search for plantar wart, images.  I told him that he had a wart and not a foreign body.  We discussed over-the-counter medicines for treatment, freezing, salicylic acid plasters.  He will go to podiatry if it fails to improve. Final Clinical Impressions(s) / UC Diagnoses   Final diagnoses:  Plantar wart     Discharge Instructions     You will need wart treatment You may try over the counter products You may go to a podiatrist if it does not clear up   ED Prescriptions    None     Controlled Substance Prescriptions [place] Controlled Substance Registry consulted? Not Applicable   Raylene Everts, MD 11/18/17 1341

## 2017-11-18 NOTE — ED Triage Notes (Signed)
Pt states two months ago he noticed something in the bottom of his L foot. Pt states hes tried to remove it himself but its deep. States when he walks normally he doesn't feel pain but if he steps on something small that presses on the area, it causes pain.

## 2017-11-18 NOTE — Discharge Instructions (Signed)
You will need wart treatment You may try over the counter products You may go to a podiatrist if it does not clear up

## 2019-01-21 DIAGNOSIS — J0140 Acute pansinusitis, unspecified: Secondary | ICD-10-CM | POA: Diagnosis not present

## 2019-01-21 DIAGNOSIS — J40 Bronchitis, not specified as acute or chronic: Secondary | ICD-10-CM | POA: Diagnosis not present

## 2019-11-22 DIAGNOSIS — L821 Other seborrheic keratosis: Secondary | ICD-10-CM | POA: Diagnosis not present

## 2019-11-22 DIAGNOSIS — L918 Other hypertrophic disorders of the skin: Secondary | ICD-10-CM | POA: Diagnosis not present

## 2019-11-22 DIAGNOSIS — D235 Other benign neoplasm of skin of trunk: Secondary | ICD-10-CM | POA: Diagnosis not present
